# Patient Record
Sex: Female | Born: 2000 | Hispanic: Yes | Marital: Single | Smoking: Never smoker
Health system: Southern US, Community
[De-identification: ages and names within clinical notes are randomized; demographics above are authoritative.]

## PROBLEM LIST (undated history)

## (undated) DIAGNOSIS — E669 Obesity, unspecified: Secondary | ICD-10-CM

## (undated) DIAGNOSIS — J45909 Unspecified asthma, uncomplicated: Secondary | ICD-10-CM

## (undated) HISTORY — PX: NO PAST SURGERIES: SHX2092

---
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
## 2020-10-10 ENCOUNTER — Ambulatory Visit
Admission: RE | Admit: 2020-10-10 | Discharge: 2020-10-10 | Disposition: A | Discharge status: Home / Self Care | Payer: Medicaid Other | Source: Ambulatory Visit | Attending: Primary Care | Admitting: Primary Care

## 2020-10-10 ENCOUNTER — Other Ambulatory Visit: Payer: Self-pay | Admitting: Primary Care

## 2020-10-10 DIAGNOSIS — M65322 Trigger finger, left index finger: Secondary | ICD-10-CM

## 2024-03-26 ENCOUNTER — Emergency Department
Admission: EM | Admit: 2024-03-26 | Discharge: 2024-03-26 | Disposition: A | Discharge status: Home / Self Care | Payer: Self-pay | Attending: Emergency Medicine | Admitting: Emergency Medicine

## 2024-03-26 ENCOUNTER — Emergency Department: Payer: Self-pay

## 2024-03-26 ENCOUNTER — Other Ambulatory Visit: Payer: Self-pay

## 2024-03-26 DIAGNOSIS — S93402A Sprain of unspecified ligament of left ankle, initial encounter: Secondary | ICD-10-CM | POA: Insufficient documentation

## 2024-03-26 DIAGNOSIS — W109XXA Fall (on) (from) unspecified stairs and steps, initial encounter: Secondary | ICD-10-CM | POA: Insufficient documentation

## 2024-03-26 DIAGNOSIS — M25572 Pain in left ankle and joints of left foot: Secondary | ICD-10-CM

## 2024-03-26 NOTE — ED Triage Notes (Addendum)
 Pt presents with L ankle pain that started after falling down 2 stairs at 01:00 yesterday morning. The pain and swelling has gradually worsened. Pt is having difficulty bearing weight. She is also c/o injury and soreness to the 2nd and 3rd digit on her R foot without swelling. Denies head injury or LOC. She has taken APAP without relief.

## 2024-03-26 NOTE — Discharge Instructions (Addendum)
 Please use ibuprofen (Motrin) up to 800 mg every 8 hours, naproxen (Naprosyn) up to 500 mg every 12 hours, and/or acetaminophen (Tylenol) up to 4 g/day for any continued pain.  Please do not use this medication regimen for longer than 7 days

## 2024-03-26 NOTE — ED Provider Notes (Signed)
 [facility] Provider Note   Event Date/Time   First MD Initiated Contact with Patient 03/26/24 1143     (approximate) History  Ankle Pain  HPI Chloe Price is a 23 y.o. female with no stated past medical history that presents 24 hours after a mechanical fall from standing resulting in left ankle pain.  Patient states she does not know exactly how she fell although when she attempted to walk on it after this fall she does endorse a episode of internal rotation of the left foot that resulted in even more pain over the proximal anterior lateral aspect of the left ankle.  Patient has been ambulatory on this ankle since the injury however states that it is worse when sitting down ROS: Patient currently denies any vision changes, tinnitus, difficulty speaking, facial droop, sore throat, chest pain, shortness of breath, abdominal pain, nausea/vomiting/diarrhea, dysuria, or weakness/numbness/paresthesias in any extremity   Physical Exam  Triage Vital Signs: ED Triage Vitals  Encounter Vitals Group     BP 03/26/24 1151 108/71     Girls Systolic BP Percentile --      Girls Diastolic BP Percentile --      Boys Systolic BP Percentile --      Boys Diastolic BP Percentile --      Pulse Rate 03/26/24 1151 100     Resp 03/26/24 1151 20     Temp 03/26/24 1151 98.5 F (36.9 C)     Temp Source 03/26/24 1151 Oral     SpO2 03/26/24 1151 100 %     Weight 03/26/24 1120 (!) 339 lb (153.8 kg)     Height 03/26/24 1120 5' 4 (1.626 m)     Head Circumference --      Peak Flow --      Pain Score 03/26/24 1119 7     Pain Loc --      Pain Education --      Exclude from Growth Chart --    Most recent vital signs: Vitals:   03/26/24 1151  BP: 108/71  Pulse: 100  Resp: 20  Temp: 98.5 F (36.9 C)  SpO2: 100%   General: Awake, oriented x4. CV:  Good peripheral perfusion. Resp:  Normal effort. Abd:  No distention. Other:  Young adult morbidly obese Hispanic female  resting comfortably in no acute distress.  Edema and ecchymosis over the proximal anterior lateral aspect of the left ankle.  Patient has no point tenderness palpation including under the arch of the foot where the cortical irregularity seen on x-ray ED Results / Procedures / Treatments  Labs (all labs ordered are listed, but only abnormal results are displayed) Labs Reviewed - No data to display RADIOLOGY ED MD interpretation: X-ray of the left ankle shows possible cortical discontinuity along the inferior margin of the cuboid bone - All radiology independently interpreted and agree with radiology assessment Official radiology report(s): DG Ankle Complete Left Result Date: 03/26/2024 CLINICAL DATA:  injury, swelling EXAM: LEFT ANKLE COMPLETE - 3+ VIEW COMPARISON:  None Available. FINDINGS: Possible cortical discontinuity along the inferior margin of the cuboid seen only on lateral view. No additional definitive acute fracture or dislocation. Joint spaces and alignment are maintained. No area of erosion or osseous destruction. No unexpected radiopaque foreign body. Circumferential soft tissue edema. IMPRESSION: Possible cortical discontinuity along the inferior margin of the cuboid seen only on lateral view. This could reflect a nondisplaced fracture. Recommend correlation with point tenderness with consideration of dedicated foot  radiographs for improved evaluation. Electronically Signed   By: Corean Salter M.D.   On: 03/26/2024 11:57   PROCEDURES: Critical Care performed: No Procedures MEDICATIONS ORDERED IN ED: Medications - No data to display IMPRESSION / MDM / ASSESSMENT AND PLAN / ED COURSE  I reviewed the triage vital signs and the nursing notes.                             The patient is on the cardiac monitor to evaluate for evidence of arrhythmia and/or significant heart rate changes. Patient's presentation is most consistent with acute presentation with potential threat to life  or bodily function. Patient is a 23 year old female with the above-stated past medical history presents for left ankle pain after a fall 2020 4 hours prior to arrival Given history, exam and workup I have low suspicion for fracture, dislocation, significant ligamentous injury, septic arthritis, gout flare, new autoimmune arthropathy, or gonococcal arthropathy.  Interventions: X-ray of the left ankle shows cortical irregularity in the cuboid bone that is a possible fracture. On patient's reexamination, there is no tenderness to palpation over the arch of the foot overlying this cuboid bone and therefore I have low suspicion for fracture at this time.  Patient given Ace wrap and crutches as well as instructions for weightbearing as tolerated.  Patient given orthopedic follow-up as needed if symptoms not improve Disposition: Discharge home with strict return precautions and instructions for prompt primary care follow up in the next week.   FINAL CLINICAL IMPRESSION(S) / ED DIAGNOSES   Final diagnoses:  Acute left ankle pain  Sprain of left ankle, unspecified ligament, initial encounter   Rx / DC Orders   ED Discharge Orders     None      Note:  This document was prepared using Dragon voice recognition software and may include unintentional dictation errors.   Megann Easterwood K, MD 03/26/24 [phone]
